# Patient Record
Sex: Female | Born: 2005 | Race: White | Hispanic: No | Marital: Single
Health system: Southern US, Community
[De-identification: ages and names within clinical notes are randomized; demographics above are authoritative.]

## PROBLEM LIST (undated history)

## (undated) DIAGNOSIS — F419 Anxiety disorder, unspecified: Secondary | ICD-10-CM

## (undated) DIAGNOSIS — F4310 Post-traumatic stress disorder, unspecified: Secondary | ICD-10-CM

## (undated) DIAGNOSIS — F319 Bipolar disorder, unspecified: Secondary | ICD-10-CM

## (undated) HISTORY — DX: Anxiety disorder, unspecified: F41.9

## (undated) HISTORY — DX: Bipolar disorder, unspecified: F31.9

## (undated) HISTORY — DX: Post-traumatic stress disorder, unspecified: F43.10

---
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
## 2024-05-14 ENCOUNTER — Ambulatory Visit (INDEPENDENT_AMBULATORY_CARE_PROVIDER_SITE_OTHER): Payer: Self-pay | Admitting: Neurology

## 2024-09-12 ENCOUNTER — Encounter: Payer: Self-pay | Admitting: Family Medicine
